# Patient Record
Sex: Female | Born: 1955 | Race: White | Hispanic: No | Marital: Married | Smoking: Never smoker
Health system: Southern US, Community
[De-identification: ages and names within clinical notes are randomized; demographics above are authoritative.]

## PROBLEM LIST (undated history)

## (undated) DIAGNOSIS — J45909 Unspecified asthma, uncomplicated: Secondary | ICD-10-CM

## (undated) DIAGNOSIS — I10 Essential (primary) hypertension: Secondary | ICD-10-CM

## (undated) HISTORY — PX: PLACEMENT OF BREAST IMPLANTS: SHX6334

## (undated) HISTORY — PX: SHOULDER ARTHROSCOPY: SHX128

## (undated) HISTORY — PX: ABDOMINAL HYSTERECTOMY: SHX81

## (undated) HISTORY — PX: CERVICAL DISC ARTHROPLASTY: SHX587

---

## 1999-10-31 ENCOUNTER — Other Ambulatory Visit: Admission: RE | Admit: 1999-10-31 | Discharge: 1999-10-31 | Payer: Self-pay | Admitting: Gynecology

## 2002-08-28 ENCOUNTER — Ambulatory Visit (HOSPITAL_COMMUNITY): Admission: RE | Admit: 2002-08-28 | Discharge: 2002-08-28 | Payer: Self-pay | Admitting: Gastroenterology

## 2002-08-28 ENCOUNTER — Encounter (INDEPENDENT_AMBULATORY_CARE_PROVIDER_SITE_OTHER): Payer: Self-pay | Admitting: Specialist

## 2004-02-08 ENCOUNTER — Other Ambulatory Visit: Admission: RE | Admit: 2004-02-08 | Discharge: 2004-02-08 | Payer: Self-pay | Admitting: Gynecology

## 2004-03-31 ENCOUNTER — Ambulatory Visit (HOSPITAL_COMMUNITY): Admission: RE | Admit: 2004-03-31 | Discharge: 2004-03-31 | Payer: Self-pay | Admitting: Family Medicine

## 2004-05-23 ENCOUNTER — Encounter: Admission: RE | Admit: 2004-05-23 | Discharge: 2004-05-23 | Payer: Self-pay | Admitting: Otolaryngology

## 2004-06-06 ENCOUNTER — Encounter: Admission: RE | Admit: 2004-06-06 | Discharge: 2004-06-06 | Payer: Self-pay | Admitting: Otolaryngology

## 2006-11-06 ENCOUNTER — Emergency Department (HOSPITAL_COMMUNITY): Admission: EM | Admit: 2006-11-06 | Discharge: 2006-11-06 | Payer: Self-pay | Admitting: Emergency Medicine

## 2008-10-13 ENCOUNTER — Encounter (INDEPENDENT_AMBULATORY_CARE_PROVIDER_SITE_OTHER): Payer: Self-pay | Admitting: Interventional Radiology

## 2008-10-13 ENCOUNTER — Encounter: Admission: RE | Admit: 2008-10-13 | Discharge: 2008-10-13 | Payer: Self-pay | Admitting: Family Medicine

## 2008-10-13 ENCOUNTER — Other Ambulatory Visit: Admission: RE | Admit: 2008-10-13 | Discharge: 2008-10-13 | Payer: Self-pay | Admitting: Interventional Radiology

## 2009-10-25 ENCOUNTER — Encounter: Admission: RE | Admit: 2009-10-25 | Discharge: 2009-10-25 | Payer: Self-pay | Admitting: Family Medicine

## 2010-10-24 ENCOUNTER — Ambulatory Visit
Admission: RE | Admit: 2010-10-24 | Discharge: 2010-10-24 | Payer: Self-pay | Source: Home / Self Care | Attending: Internal Medicine | Admitting: Internal Medicine

## 2011-02-09 NOTE — Op Note (Signed)
NAME:  Michelle Hanna, Michelle Hanna                         ACCOUNT NO.:  [id number]   MEDICAL RECORD NO.:  [id number]                   PATIENT TYPE:  AMB   LOCATION:  ENDO                                 FACILITY:  [facility]   PHYSICIAN:  Danise Edge, M.D.                DATE OF BIRTH:  12/16/1955   DATE OF PROCEDURE:  08/28/2002  DATE OF DISCHARGE:                                 OPERATIVE REPORT   PROCEDURE:  Colonoscopy.   PROCEDURE INDICATIONS:  The patient is a 55 year old female born Jan 26, 1956.  On July 20, 1996 underwent a proctocolonoscopy to the cecum which  was essentially normal except for the presence of left colonic  diverticulosis (mild).  Left colonic biopsies were performed to rule out  microscopic - cholanginous colitis.  Pathology interpretation of the left  colon biopsies returned focal active colitis with granulomas, strongly  suggestive of Crohn's colitis.   The patient has no clinical or colonoscopic evidence for the presence of  Crohn's disease and should not be diagnosed as having Crohn's disease at  this point.  She has symptoms compatible with irritable bowel syndrome  (abdominal cramps and nonbloody diarrhea).  A follow-up colonoscopy is  scheduled today.   ENDOSCOPIST:  Danise Edge, M.D.   PREMEDICATION:  Versed 5 mg, Demerol 50 mg   ENDOSCOPE:  Olympus pediatric colonoscope.   PROCEDURE IN DETAIL:  After obtaining informed consent, the patient was  placed in the left lateral decubitus position.  I administered intravenous  Demerol and intravenous Versed to achieve conscious sedation for the  procedure.  The patient's blood pressure, oxygen saturation and cardiac  rhythm were monitored throughout the procedure and documented in the medical  record.   Anal inspection was normal.  Digital rectal exam was normal.  The Olympus  pediatric video colonoscope was introduced into the rectum and advanced to  the cecum.  A normal appearing ileocecal valve was  intubated and the distal  ileum inspected.  Colonic preparation for the exam today was excellent.   Rectum:  Normal.   Sigmoid colon and descending colon:  A few colonic diverticula are noted  with normal appearing mucosa.   Splenic flexure:  Normal.   Transverse colon:  Normal.   Hepatic flexure:  Normal.   Ascending colon:  Normal.   Cecum and ileocecal valve:  Normal.   Distal ileum:  Normal.   Biopsies:  Random colonic biopsies from normal appearing colonic mucosa were  taken from the right colon and left colon to rule out microscopic-  cholanginous colitis.   ASSESSMENT:  A few diverticula are noted in the left colon; otherwise normal  proctocolonoscopy to the cecum and distal ileoscopy.  I find no colonoscopic  evidence for the presence of Crohn's colitis by today's exam, August 28, 2002 and by the colonoscopic exam performed July 20, 1996.   PLAN:  I plan to go  over the patient's biopsies with her.  I suspect she has  irritable bowel syndrome.  With two colonoscopic examines performed in 1997  and 2003 I find no evidence for the presence of inflammatory bowel disease.                                               Danise Edge, M.D.    MJ/MEDQ  D:  08/28/2002  T:  08/29/2002  Job:  [id number]   cc:   Dario Guardian, M.D.  [address]  [place]  [place] [address]  Fax: [phone]

## 2011-05-25 ENCOUNTER — Other Ambulatory Visit: Payer: Self-pay | Admitting: Obstetrics and Gynecology

## 2012-03-03 ENCOUNTER — Ambulatory Visit
Admission: RE | Admit: 2012-03-03 | Discharge: 2012-03-03 | Disposition: A | Discharge status: Home / Self Care | Payer: BC Managed Care – PPO | Source: Ambulatory Visit | Attending: Family Medicine | Admitting: Family Medicine

## 2012-03-03 ENCOUNTER — Other Ambulatory Visit: Payer: Self-pay | Admitting: Family Medicine

## 2012-03-03 DIAGNOSIS — R52 Pain, unspecified: Secondary | ICD-10-CM

## 2013-06-23 ENCOUNTER — Other Ambulatory Visit: Payer: Self-pay | Admitting: Obstetrics and Gynecology

## 2014-08-27 ENCOUNTER — Other Ambulatory Visit: Payer: Self-pay | Admitting: Obstetrics and Gynecology

## 2014-08-30 LAB — CYTOLOGY - PAP

## 2018-01-05 ENCOUNTER — Inpatient Hospital Stay (HOSPITAL_COMMUNITY)
Admission: EM | Admit: 2018-01-05 | Discharge: 2018-01-07 | DRG: 418 | Disposition: A | Discharge status: Home / Self Care | Payer: BC Managed Care – PPO | Attending: Surgery | Admitting: Surgery

## 2018-01-05 ENCOUNTER — Encounter (HOSPITAL_COMMUNITY): Payer: Self-pay | Admitting: Emergency Medicine

## 2018-01-05 ENCOUNTER — Emergency Department (HOSPITAL_COMMUNITY): Payer: BC Managed Care – PPO

## 2018-01-05 DIAGNOSIS — K8012 Calculus of gallbladder with acute and chronic cholecystitis without obstruction: Secondary | ICD-10-CM | POA: Diagnosis present

## 2018-01-05 DIAGNOSIS — Z888 Allergy status to other drugs, medicaments and biological substances status: Secondary | ICD-10-CM

## 2018-01-05 DIAGNOSIS — Z885 Allergy status to narcotic agent status: Secondary | ICD-10-CM

## 2018-01-05 DIAGNOSIS — K5090 Crohn's disease, unspecified, without complications: Secondary | ICD-10-CM | POA: Diagnosis present

## 2018-01-05 DIAGNOSIS — Z880 Allergy status to penicillin: Secondary | ICD-10-CM

## 2018-01-05 DIAGNOSIS — K810 Acute cholecystitis: Secondary | ICD-10-CM

## 2018-01-05 DIAGNOSIS — I10 Essential (primary) hypertension: Secondary | ICD-10-CM | POA: Diagnosis present

## 2018-01-05 DIAGNOSIS — R1011 Right upper quadrant pain: Secondary | ICD-10-CM

## 2018-01-05 DIAGNOSIS — F419 Anxiety disorder, unspecified: Secondary | ICD-10-CM | POA: Diagnosis present

## 2018-01-05 DIAGNOSIS — J45909 Unspecified asthma, uncomplicated: Secondary | ICD-10-CM | POA: Diagnosis present

## 2018-01-05 DIAGNOSIS — F329 Major depressive disorder, single episode, unspecified: Secondary | ICD-10-CM | POA: Diagnosis present

## 2018-01-05 DIAGNOSIS — Z419 Encounter for procedure for purposes other than remedying health state, unspecified: Secondary | ICD-10-CM

## 2018-01-05 DIAGNOSIS — K8000 Calculus of gallbladder with acute cholecystitis without obstruction: Secondary | ICD-10-CM | POA: Diagnosis present

## 2018-01-05 HISTORY — DX: Essential (primary) hypertension: I10

## 2018-01-05 HISTORY — DX: Unspecified asthma, uncomplicated: J45.909

## 2018-01-05 LAB — URINALYSIS, ROUTINE W REFLEX MICROSCOPIC
Bacteria, UA: NONE SEEN
Bilirubin Urine: NEGATIVE
GLUCOSE, UA: NEGATIVE mg/dL
Hgb urine dipstick: NEGATIVE
Ketones, ur: NEGATIVE mg/dL
LEUKOCYTES UA: NEGATIVE
Nitrite: NEGATIVE
PH: 8 (ref 5.0–8.0)
Protein, ur: 30 mg/dL — AB
SPECIFIC GRAVITY, URINE: 1.02 (ref 1.005–1.030)

## 2018-01-05 LAB — CBC
HCT: 38.2 % (ref 36.0–46.0)
Hemoglobin: 13.1 g/dL (ref 12.0–15.0)
MCH: 30.3 pg (ref 26.0–34.0)
MCHC: 34.3 g/dL (ref 30.0–36.0)
MCV: 88.4 fL (ref 78.0–100.0)
Platelets: 313 10*3/uL (ref 150–400)
RBC: 4.32 MIL/uL (ref 3.87–5.11)
RDW: 12.8 % (ref 11.5–15.5)
WBC: 9 10*3/uL (ref 4.0–10.5)

## 2018-01-05 LAB — LIPASE, BLOOD: LIPASE: 31 U/L (ref 11–51)

## 2018-01-05 LAB — COMPREHENSIVE METABOLIC PANEL
ALK PHOS: 110 U/L (ref 38–126)
ALT: 33 U/L (ref 14–54)
AST: 31 U/L (ref 15–41)
Albumin: 4 g/dL (ref 3.5–5.0)
Anion gap: 13 (ref 5–15)
BILIRUBIN TOTAL: 0.5 mg/dL (ref 0.3–1.2)
BUN: 13 mg/dL (ref 6–20)
CALCIUM: 9.8 mg/dL (ref 8.9–10.3)
CHLORIDE: 106 mmol/L (ref 101–111)
CO2: 20 mmol/L — ABNORMAL LOW (ref 22–32)
Creatinine, Ser: 0.82 mg/dL (ref 0.44–1.00)
Glucose, Bld: 114 mg/dL — ABNORMAL HIGH (ref 65–99)
Potassium: 3.9 mmol/L (ref 3.5–5.1)
Sodium: 139 mmol/L (ref 135–145)
Total Protein: 7.5 g/dL (ref 6.5–8.1)

## 2018-01-05 MED ORDER — HYDROMORPHONE HCL 2 MG/ML IJ SOLN
0.5000 mg | Freq: Once | INTRAMUSCULAR | Status: AC
  Administered 2018-01-05: 0.5 mg via INTRAVENOUS
  Filled 2018-01-05: qty 1

## 2018-01-05 MED ORDER — ONDANSETRON HCL 4 MG/2ML IJ SOLN: 4.0000 mg | Freq: Four times a day (QID) | INTRAMUSCULAR | Status: DC | PRN

## 2018-01-05 MED ORDER — SERTRALINE HCL 50 MG PO TABS
25.0000 mg | ORAL_TABLET | Freq: Every day | ORAL | Status: DC
  Administered 2018-01-06 – 2018-01-07 (×2): 25 mg via ORAL
  Filled 2018-01-05 (×3): qty 1

## 2018-01-05 MED ORDER — LISINOPRIL 5 MG PO TABS
2.5000 mg | ORAL_TABLET | Freq: Every day | ORAL | Status: DC
  Filled 2018-01-05 (×3): qty 1

## 2018-01-05 MED ORDER — DIPHENHYDRAMINE HCL 50 MG/ML IJ SOLN: 12.5000 mg | Freq: Four times a day (QID) | INTRAMUSCULAR | Status: DC | PRN

## 2018-01-05 MED ORDER — ONDANSETRON 4 MG PO TBDP: 4.0000 mg | ORAL_TABLET | Freq: Four times a day (QID) | ORAL | Status: DC | PRN

## 2018-01-05 MED ORDER — SODIUM CHLORIDE 0.9 % IV SOLN
INTRAVENOUS | Status: DC
  Administered 2018-01-05 – 2018-01-06 (×2): via INTRAVENOUS

## 2018-01-05 MED ORDER — ACETAMINOPHEN 650 MG RE SUPP: 650.0000 mg | Freq: Four times a day (QID) | RECTAL | Status: DC | PRN

## 2018-01-05 MED ORDER — MORPHINE SULFATE (PF) 4 MG/ML IV SOLN
2.0000 mg | INTRAVENOUS | Status: DC | PRN
  Administered 2018-01-05: 4 mg via INTRAVENOUS
  Filled 2018-01-05 (×2): qty 1

## 2018-01-05 MED ORDER — SODIUM CHLORIDE 0.9 % IV SOLN
2.0000 g | INTRAVENOUS | Status: DC
  Filled 2018-01-05 (×2): qty 20

## 2018-01-05 MED ORDER — ACETAMINOPHEN 325 MG PO TABS
650.0000 mg | ORAL_TABLET | Freq: Four times a day (QID) | ORAL | Status: DC | PRN
  Administered 2018-01-07 (×2): 650 mg via ORAL
  Filled 2018-01-05 (×2): qty 2

## 2018-01-05 MED ORDER — SODIUM CHLORIDE 0.9 % IV SOLN
2.0000 g | Freq: Once | INTRAVENOUS | Status: AC
  Administered 2018-01-05: 2 g via INTRAVENOUS
  Filled 2018-01-05: qty 20

## 2018-01-05 MED ORDER — DIPHENHYDRAMINE HCL 12.5 MG/5ML PO ELIX: 12.5000 mg | ORAL_SOLUTION | Freq: Four times a day (QID) | ORAL | Status: DC | PRN

## 2018-01-05 NOTE — ED Triage Notes (Signed)
Patient presents to ED for assessment RUQ pain which she has had 5 episodes, 3 in the past month.  Patient states she has been trying vinegar and other home remedies without relief.  Patient states pain started this morning at 330am, 3 episodes of diarrhea, c/o nausea, denies vomiting.

## 2018-01-05 NOTE — ED Notes (Signed)
Clear liquid dinner tray ordered for pt

## 2018-01-05 NOTE — ED Provider Notes (Signed)
[facility] EMERGENCY DEPARTMENT Provider Note   CSN: [id number] Arrival date & time: 01/05/18  1124     History   Chief Complaint Chief Complaint  Patient presents with  . Abdominal Pain    HPI Michelle Hanna is a 62 y.o. female who presents wi abdominal pain.  Past medical history significant for asthma, hypertension, Crohn's disease, anxiety/depression.  She states that she has had 3 "attacks" this month.  The pain will typically wake her up from sleep in the middle of the night and last for several hours and resolve on its own.  She has been trying apple cider vinegar mixed with apple juice which has provided some relief up until today.  She states that she thinks the problem is her gallbladder.  She endorses eating fatty meals last night.  She denies fever but has had chills, sweats, nausea. She's also had changes in her bowels however she is unsure if this was due to her Crohn's or not. Her stool has been lighter and loose. The pain is a squeezing pain over the right upper quadrant and right flank and sometimes it feels like being stabbed with a knife.  Movement makes it worse.  Nothing is made it better.  She denies chest pain, shortness of breath, vomiting, urinary symptoms. Past surgical history significant for C-section and abdominal hysterectomy.   HPI  Past Medical History:  Diagnosis Date  . Asthma   . Hypertension     There are no active problems to display for this patient.   Past Surgical History:  Procedure Laterality Date  . ABDOMINAL HYSTERECTOMY    . CERVICAL DISC ARTHROPLASTY    . CESAREAN SECTION    . PLACEMENT OF BREAST IMPLANTS    . SHOULDER ARTHROSCOPY       OB History   None      Home Medications    Prior to Admission medications   Not on File    Family History History reviewed. No pertinent family history.  Social History Social History   Tobacco Use  . Smoking status: Never Smoker  . Smokeless tobacco: Never  Used  Substance Use Topics  . Alcohol use: Yes    Comment: lightly  . Drug use: Never     Allergies   Codeine; Prednisone; and Penicillins   Review of Systems Review of Systems  Constitutional: Positive for chills and diaphoresis. Negative for fever.  Respiratory: Negative for shortness of breath.   Cardiovascular: Negative for chest pain.  Gastrointestinal: Positive for abdominal pain, diarrhea and nausea. Negative for constipation and vomiting.  Genitourinary: Positive for flank pain. Negative for difficulty urinating, dysuria, frequency and hematuria.  All other systems reviewed and are negative.    Physical Exam Updated Vital Signs BP 130/71 (BP Location: Left Arm)   Pulse 66   Temp 98.2 F (36.8 C) (Oral)   Resp (!) 24   SpO2 100%   Physical Exam  Constitutional: She is oriented to person, place, and time. She appears well-developed and well-nourished. She appears distressed (Mild distress due to pain).  HENT:  Head: Normocephalic and atraumatic.  Eyes: Pupils are equal, round, and reactive to light. Conjunctivae are normal. Right eye exhibits no discharge. Left eye exhibits no discharge. No scleral icterus.  Neck: Normal range of motion.  Cardiovascular: Normal rate and regular rhythm.  Pulmonary/Chest: Effort normal and breath sounds normal. No respiratory distress.  Abdominal: Soft. Bowel sounds are normal. She exhibits no distension and no mass. There  is tenderness (Epigastric, periumbilical, right upper quadrant tenderness). There is no rebound and no guarding. No hernia.  Right CVA tenderness  Neurological: She is alert and oriented to person, place, and time.  Skin: Skin is warm and dry.  Psychiatric: She has a normal mood and affect. Her behavior is normal.  Nursing note and vitals reviewed.    ED Treatments / Results  Labs (all labs ordered are listed, but only abnormal results are displayed) Labs Reviewed  COMPREHENSIVE METABOLIC PANEL - Abnormal;  Notable for the following components:      Result Value   CO2 20 (*)    Glucose, Bld 114 (*)    All other components within normal limits  URINALYSIS, ROUTINE W REFLEX MICROSCOPIC - Abnormal; Notable for the following components:   Protein, ur 30 (*)    Squamous Epithelial / LPF 0-5 (*)    All other components within normal limits  LIPASE, BLOOD  CBC    EKG None  Radiology No results found.  Procedures Procedures (including critical care time)  Medications Ordered in ED Medications  cefTRIAXone (ROCEPHIN) 2 g in sodium chloride 0.9 % 100 mL IVPB (2 g Intravenous New Bag/Given 01/05/18 1555)  HYDROmorphone (DILAUDID) injection 0.5 mg (0.5 mg Intravenous Given 01/05/18 1315)     Initial Impression / Assessment and Plan / ED Course  I have reviewed the triage vital signs and the nursing notes.  Pertinent labs & imaging results that were available during my care of the patient were reviewed by me and considered in my medical decision making (see chart for details).  61 year old female presents with right upper quadrant pain since 3 AM this morning.  Her vital signs are normal.  She is in mild distress due to pain.  She had significant pain relief with Dilaudid.  She has generalized tenderness on exam but the patient states that her pain is mostly in the right upper quadrant.  Her blood work is normal.  Her urine is normal.  Right upper quadrant ultrasound was ordered.  Ultrasound is remarkable for possible cholecystitis.  On recheck the patient appears much more comfortable.  She has been n.p.o. since 6 AM this morning.  Discussed with Dr. Corliss Skains who will come to see the patient.  4:46 PM Patient will be admitted by surgery.  Final Clinical Impressions(s) / ED Diagnoses   Final diagnoses:  RUQ pain  Acute cholecystitis    ED Discharge Orders    None       Bethel Born, PA-C 01/05/18 1646    Mancel Bale, MD 01/06/18 1258

## 2018-01-05 NOTE — H&P (Signed)
Michelle Hanna is an 62 y.o. female.    GI:  Buccini Chief Complaint: RUQ pain HPI: This is a 62 yo female with asthma, HTN, intermittent Crohns' disease, depression who presents with five separate "attacks" of RUQ abdominal pain.  This is the worst episode.  This began about 2 am with RUQ pain radiating around to the right flank.  Mild nausea, chills.  No diarrhea.  Pain slightly improved with IV pain meds.  Past Medical History:  Diagnosis Date  . Asthma   . Hypertension   Intermittent Crohns' disease - no meds  Past Surgical History:  Procedure Laterality Date  . ABDOMINAL HYSTERECTOMY    . CERVICAL DISC ARTHROPLASTY    . CESAREAN SECTION    . PLACEMENT OF BREAST IMPLANTS    . SHOULDER ARTHROSCOPY      History reviewed. No pertinent family history. Social History:  reports that she has never smoked. She has never used smokeless tobacco. She reports that she drinks alcohol. She reports that she does not use drugs.  Allergies:  Allergies  Allergen Reactions  . Codeine Itching and Nausea And Vomiting  . Prednisone Swelling  . Penicillins Rash    Prior to Admission medications   Not on File     Results for orders placed or performed during the hospital encounter of 01/05/18 (from the past 48 hour(s))  Lipase, blood     Status: None   Collection Time: 01/05/18 12:06 PM  Result Value Ref Range   Lipase 31 11 - 51 U/L    Comment: Performed at [facility], [place] [address], [place], [place] [address]  Comprehensive metabolic panel     Status: Abnormal   Collection Time: 01/05/18 12:06 PM  Result Value Ref Range   Sodium 139 135 - 145 mmol/L   Potassium 3.9 3.5 - 5.1 mmol/L   Chloride 106 101 - 111 mmol/L   CO2 20 (L) 22 - 32 mmol/L   Glucose, Bld 114 (H) 65 - 99 mg/dL   BUN 13 6 - 20 mg/dL   Creatinine, Ser 0.82 0.44 - 1.00 mg/dL   Calcium 9.8 8.9 - 10.3 mg/dL   Total Protein 7.5 6.5 - 8.1 g/dL   Albumin 4.0 3.5 - 5.0 g/dL   AST 31 15 - 41 U/L   ALT 33 14 -  54 U/L   Alkaline Phosphatase 110 38 - 126 U/L   Total Bilirubin 0.5 0.3 - 1.2 mg/dL   GFR calc non Af Amer >60 >60 mL/min   GFR calc Af Amer >60 >60 mL/min    Comment: (NOTE) The eGFR has been calculated using the CKD EPI equation. This calculation has not been validated in all clinical situations. eGFR's persistently <60 mL/min signify possible Chronic Kidney Disease.    Anion gap 13 5 - 15    Comment: Performed at [facility] [address] [place], [place] [address]  CBC     Status: None   Collection Time: 01/05/18 12:06 PM  Result Value Ref Range   WBC 9.0 4.0 - 10.5 K/uL   RBC 4.32 3.87 - 5.11 MIL/uL   Hemoglobin 13.1 12.0 - 15.0 g/dL   HCT 38.2 36.0 - 46.0 %   MCV 88.4 78.0 - 100.0 fL   MCH 30.3 26.0 - 34.0 pg   MCHC 34.3 30.0 - 36.0 g/dL   RDW 12.8 11.5 - 15.5 %   Platelets 313 150 - 400 K/uL    Comment: Performed at  [facility], [place] [address] [place], [place] [address]  Urinalysis, Routine w reflex microscopic     Status: Abnormal   Collection Time: 01/05/18 12:41 PM  Result Value Ref Range   Color, Urine YELLOW YELLOW   APPearance CLEAR CLEAR   Specific Gravity, Urine 1.020 1.005 - 1.030   pH 8.0 5.0 - 8.0   Glucose, UA NEGATIVE NEGATIVE mg/dL   Hgb urine dipstick NEGATIVE NEGATIVE   Bilirubin Urine NEGATIVE NEGATIVE   Ketones, ur NEGATIVE NEGATIVE mg/dL   Protein, ur 30 (A) NEGATIVE mg/dL   Nitrite NEGATIVE NEGATIVE   Leukocytes, UA NEGATIVE NEGATIVE   RBC / HPF 0-5 0 - 5 RBC/hpf   WBC, UA 0-5 0 - 5 WBC/hpf   Bacteria, UA NONE SEEN NONE SEEN   Squamous Epithelial / LPF 0-5 (A) NONE SEEN   Mucus PRESENT     Comment: Performed at [facility] [address], [place], [place] [address]   US Abdomen Limited Ruq  Result Date: 01/05/2018 CLINICAL DATA:  Right upper quadrant pain. EXAM: ULTRASOUND ABDOMEN LIMITED RIGHT UPPER QUADRANT COMPARISON:  Abdominal ultrasound dated November 06, 2006. FINDINGS: Gallbladder: Cholelithiasis and sludge  with borderline wall thickening and small amount of pericholecystic fluid. No sonographic Murphy sign noted by sonographer. Common bile duct: Diameter: 5 mm, normal. Liver: There is a small 1.0 cm simple cyst in the liver adjacent to the gallbladder, minimally increased in size when compared to prior study from February 2008. Within normal limits in parenchymal echogenicity. Portal vein is patent on color Doppler imaging with normal direction of blood flow towards the liver. IMPRESSION: 1. Cholelithiasis and sludge with borderline wall thickening and small amount of pericholecystic fluid. Findings are suspicious for acute cholecystitis. Electronically Signed   By: Titus Dubin M.D.   On: 01/05/2018 15:29    Review of Systems  Constitutional: Negative for weight loss.  HENT: Negative for ear discharge, ear pain, hearing loss and tinnitus.   Eyes: Negative for blurred vision, double vision, photophobia and pain.  Respiratory: Negative for cough, sputum production and shortness of breath.   Cardiovascular: Negative for chest pain.  Gastrointestinal: Positive for abdominal pain and nausea. Negative for vomiting.  Genitourinary: Positive for flank pain. Negative for dysuria, frequency and urgency.  Musculoskeletal: Negative for back pain, falls, joint pain, myalgias and neck pain.  Neurological: Negative for dizziness, tingling, sensory change, focal weakness, loss of consciousness and headaches.  Endo/Heme/Allergies: Does not bruise/bleed easily.  Psychiatric/Behavioral: Negative for depression, memory loss and substance abuse. The patient is not nervous/anxious.     Blood pressure (!) 148/73, pulse 77, temperature 98.2 F (36.8 C), temperature source Oral, resp. rate 18, SpO2 100 %. Physical Exam  WDWN in NAD Eyes:  Pupils equal, round; sclera anicteric HENT:  Oral mucosa moist; good dentition  Neck:  No masses palpated, no thyromegaly Lungs:  CTA bilaterally; normal respiratory effort CV:   Regular rate and rhythm; no murmurs; extremities well-perfused with no edema Abd:  +bowel sounds, soft, tender in epigastrium, right upper quadrant around to right flank, no palpable organomegaly; no palpable hernias Skin:  Warm, dry; no sign of jaundice Psychiatric - alert and oriented x 4; calm mood and affect  Assessment/Plan Acute calculous cholecystitis.  Admit for IV hydration, IV antibiotics Plan lap chole by Dr. Brantley Stage tomorrow.  Discussed with patient.  Maia Petties, MD 01/05/2018, 4:27 PM

## 2018-01-05 NOTE — ED Notes (Signed)
Patient transported to US 

## 2018-01-06 ENCOUNTER — Encounter (HOSPITAL_COMMUNITY): Payer: Self-pay | Admitting: Certified Registered Nurse Anesthetist

## 2018-01-06 ENCOUNTER — Inpatient Hospital Stay (HOSPITAL_COMMUNITY): Payer: BC Managed Care – PPO | Admitting: Certified Registered Nurse Anesthetist

## 2018-01-06 ENCOUNTER — Inpatient Hospital Stay (HOSPITAL_COMMUNITY): Payer: BC Managed Care – PPO

## 2018-01-06 ENCOUNTER — Other Ambulatory Visit: Payer: Self-pay

## 2018-01-06 ENCOUNTER — Encounter (HOSPITAL_COMMUNITY): Admission: EM | Disposition: A | Discharge status: Home / Self Care | Payer: Self-pay | Source: Home / Self Care

## 2018-01-06 HISTORY — PX: CHOLECYSTECTOMY: SHX55

## 2018-01-06 LAB — MRSA PCR SCREENING: MRSA BY PCR: NEGATIVE

## 2018-01-06 LAB — HIV ANTIBODY (ROUTINE TESTING W REFLEX): HIV Screen 4th Generation wRfx: NONREACTIVE

## 2018-01-06 SURGERY — LAPAROSCOPIC CHOLECYSTECTOMY WITH INTRAOPERATIVE CHOLANGIOGRAM
Anesthesia: General | Site: Abdomen

## 2018-01-06 MED ORDER — CHLORHEXIDINE GLUCONATE CLOTH 2 % EX PADS: 6.0000 | MEDICATED_PAD | Freq: Once | CUTANEOUS | Status: DC

## 2018-01-06 MED ORDER — STERILE WATER FOR IRRIGATION IR SOLN
Status: DC | PRN
  Administered 2018-01-06: 1000 mL

## 2018-01-06 MED ORDER — ROCURONIUM BROMIDE 10 MG/ML (PF) SYRINGE
PREFILLED_SYRINGE | INTRAVENOUS | Status: AC
  Filled 2018-01-06: qty 5

## 2018-01-06 MED ORDER — MIDAZOLAM HCL 5 MG/5ML IJ SOLN
INTRAMUSCULAR | Status: DC | PRN
  Administered 2018-01-06: 2 mg via INTRAVENOUS

## 2018-01-06 MED ORDER — ROCURONIUM BROMIDE 100 MG/10ML IV SOLN
INTRAVENOUS | Status: DC | PRN
  Administered 2018-01-06: 10 mg via INTRAVENOUS
  Administered 2018-01-06: 40 mg via INTRAVENOUS

## 2018-01-06 MED ORDER — DEXAMETHASONE SODIUM PHOSPHATE 10 MG/ML IJ SOLN
INTRAMUSCULAR | Status: AC
  Filled 2018-01-06: qty 1

## 2018-01-06 MED ORDER — SUGAMMADEX SODIUM 200 MG/2ML IV SOLN
INTRAVENOUS | Status: AC
  Filled 2018-01-06: qty 4

## 2018-01-06 MED ORDER — GABAPENTIN 300 MG PO CAPS
300.0000 mg | ORAL_CAPSULE | ORAL | Status: AC
  Administered 2018-01-06: 300 mg via ORAL

## 2018-01-06 MED ORDER — SODIUM CHLORIDE 0.9 % IR SOLN
Status: DC | PRN
  Administered 2018-01-06: 1000 mL

## 2018-01-06 MED ORDER — PROMETHAZINE HCL 25 MG/ML IJ SOLN: 6.2500 mg | INTRAMUSCULAR | Status: DC | PRN

## 2018-01-06 MED ORDER — ONDANSETRON HCL 4 MG/2ML IJ SOLN
INTRAMUSCULAR | Status: AC
  Filled 2018-01-06: qty 2

## 2018-01-06 MED ORDER — FENTANYL CITRATE (PF) 250 MCG/5ML IJ SOLN
INTRAMUSCULAR | Status: AC
  Filled 2018-01-06: qty 5

## 2018-01-06 MED ORDER — FENTANYL CITRATE (PF) 100 MCG/2ML IJ SOLN
INTRAMUSCULAR | Status: DC | PRN
  Administered 2018-01-06 (×2): 25 ug via INTRAVENOUS
  Administered 2018-01-06 (×2): 100 ug via INTRAVENOUS

## 2018-01-06 MED ORDER — OXYCODONE-ACETAMINOPHEN 5-325 MG PO TABS
1.0000 | ORAL_TABLET | ORAL | Status: DC | PRN
Start: 2018-01-06 — End: 2018-01-07
  Administered 2018-01-06: 1 via ORAL
  Filled 2018-01-06 (×2): qty 1

## 2018-01-06 MED ORDER — LACTATED RINGERS IV SOLN
INTRAVENOUS | Status: DC
  Administered 2018-01-06 (×3): via INTRAVENOUS

## 2018-01-06 MED ORDER — 0.9 % SODIUM CHLORIDE (POUR BTL) OPTIME
TOPICAL | Status: DC | PRN
  Administered 2018-01-06: 1000 mL

## 2018-01-06 MED ORDER — MEPERIDINE HCL 50 MG/ML IJ SOLN: 6.2500 mg | INTRAMUSCULAR | Status: DC | PRN

## 2018-01-06 MED ORDER — IOPAMIDOL (ISOVUE-300) INJECTION 61%
INTRAVENOUS | Status: AC
  Filled 2018-01-06: qty 50

## 2018-01-06 MED ORDER — SODIUM CHLORIDE 0.9 % IV SOLN
INTRAVENOUS | Status: DC | PRN
  Administered 2018-01-06: 100 mL

## 2018-01-06 MED ORDER — DEXMEDETOMIDINE HCL IN NACL 200 MCG/50ML IV SOLN
INTRAVENOUS | Status: DC | PRN
  Administered 2018-01-06: 36 ug via INTRAVENOUS

## 2018-01-06 MED ORDER — BUPIVACAINE-EPINEPHRINE (PF) 0.25% -1:200000 IJ SOLN
INTRAMUSCULAR | Status: AC
  Filled 2018-01-06: qty 30

## 2018-01-06 MED ORDER — PHENYLEPHRINE 40 MCG/ML (10ML) SYRINGE FOR IV PUSH (FOR BLOOD PRESSURE SUPPORT)
PREFILLED_SYRINGE | INTRAVENOUS | Status: AC
  Filled 2018-01-06: qty 10

## 2018-01-06 MED ORDER — HYDROMORPHONE HCL 2 MG/ML IJ SOLN: 0.2500 mg | INTRAMUSCULAR | Status: DC | PRN

## 2018-01-06 MED ORDER — ACETAMINOPHEN 500 MG PO TABS
1000.0000 mg | ORAL_TABLET | ORAL | Status: AC
  Administered 2018-01-06: 1000 mg via ORAL
  Filled 2018-01-06: qty 2

## 2018-01-06 MED ORDER — CHLORHEXIDINE GLUCONATE CLOTH 2 % EX PADS
6.0000 | MEDICATED_PAD | Freq: Once | CUTANEOUS | Status: AC
  Administered 2018-01-06: 6 via TOPICAL

## 2018-01-06 MED ORDER — HYDROCODONE-ACETAMINOPHEN 7.5-325 MG PO TABS: 1.0000 | ORAL_TABLET | Freq: Once | ORAL | Status: DC | PRN

## 2018-01-06 MED ORDER — ONDANSETRON HCL 4 MG/2ML IJ SOLN
INTRAMUSCULAR | Status: DC | PRN
  Administered 2018-01-06: 4 mg via INTRAVENOUS

## 2018-01-06 MED ORDER — SCOPOLAMINE 1 MG/3DAYS TD PT72
1.0000 | MEDICATED_PATCH | Freq: Once | TRANSDERMAL | Status: DC
  Administered 2018-01-06: 1.5 mg via TRANSDERMAL

## 2018-01-06 MED ORDER — SUGAMMADEX SODIUM 200 MG/2ML IV SOLN
INTRAVENOUS | Status: DC | PRN
  Administered 2018-01-06: 200 mg via INTRAVENOUS

## 2018-01-06 MED ORDER — BUPIVACAINE-EPINEPHRINE 0.25% -1:200000 IJ SOLN
INTRAMUSCULAR | Status: DC | PRN
  Administered 2018-01-06: 30 mL

## 2018-01-06 MED ORDER — CLINDAMYCIN PHOSPHATE 900 MG/50ML IV SOLN
900.0000 mg | INTRAVENOUS | Status: AC
  Administered 2018-01-06: 900 mg via INTRAVENOUS
  Filled 2018-01-06: qty 50

## 2018-01-06 MED ORDER — DEXAMETHASONE SODIUM PHOSPHATE 4 MG/ML IJ SOLN
INTRAMUSCULAR | Status: DC | PRN
  Administered 2018-01-06: 10 mg via INTRAVENOUS

## 2018-01-06 MED ORDER — SCOPOLAMINE 1 MG/3DAYS TD PT72
MEDICATED_PATCH | TRANSDERMAL | Status: AC
  Administered 2018-01-06: 1.5 mg via TRANSDERMAL
  Filled 2018-01-06: qty 1

## 2018-01-06 MED ORDER — MIDAZOLAM HCL 2 MG/2ML IJ SOLN
INTRAMUSCULAR | Status: AC
  Filled 2018-01-06: qty 2

## 2018-01-06 MED ORDER — EPHEDRINE SULFATE 50 MG/ML IJ SOLN
INTRAMUSCULAR | Status: DC | PRN
  Administered 2018-01-06: 10 mg via INTRAVENOUS

## 2018-01-06 MED ORDER — PROPOFOL 10 MG/ML IV BOLUS
INTRAVENOUS | Status: AC
  Filled 2018-01-06: qty 20

## 2018-01-06 MED ORDER — CELECOXIB 200 MG PO CAPS
200.0000 mg | ORAL_CAPSULE | ORAL | Status: AC
  Administered 2018-01-06: 200 mg via ORAL
  Filled 2018-01-06: qty 1

## 2018-01-06 MED ORDER — LIDOCAINE HCL (CARDIAC) 20 MG/ML IV SOLN
INTRAVENOUS | Status: DC | PRN
  Administered 2018-01-06: 100 mg via INTRAVENOUS

## 2018-01-06 MED ORDER — PROPOFOL 10 MG/ML IV BOLUS
INTRAVENOUS | Status: DC | PRN
  Administered 2018-01-06 (×2): 10 mg via INTRAVENOUS
  Administered 2018-01-06: 170 mg via INTRAVENOUS

## 2018-01-06 MED ORDER — ACETAMINOPHEN 10 MG/ML IV SOLN: 1000.0000 mg | Freq: Once | INTRAVENOUS | Status: DC | PRN

## 2018-01-06 SURGICAL SUPPLY — 47 items
ADH SKN CLS APL DERMABOND .7 (GAUZE/BANDAGES/DRESSINGS) ×1
APPLIER CLIP ROT 10 11.4 M/L (STAPLE) ×3
APR CLP MED LRG 11.4X10 (STAPLE) ×1
BAG SPEC RTRVL LRG 6X4 10 (ENDOMECHANICALS) ×1
BLADE CLIPPER SURG (BLADE) IMPLANT
CANISTER SUCT 3000ML PPV (MISCELLANEOUS) ×3 IMPLANT
CHLORAPREP W/TINT 26ML (MISCELLANEOUS) ×3 IMPLANT
CLIP APPLIE ROT 10 11.4 M/L (STAPLE) ×1 IMPLANT
COVER MAYO STAND STRL (DRAPES) ×3 IMPLANT
COVER SURGICAL LIGHT HANDLE (MISCELLANEOUS) ×3 IMPLANT
DERMABOND ADVANCED (GAUZE/BANDAGES/DRESSINGS) ×2
DERMABOND ADVANCED .7 DNX12 (GAUZE/BANDAGES/DRESSINGS) ×1 IMPLANT
DRAPE C-ARM 42X72 X-RAY (DRAPES) ×3 IMPLANT
DRAPE WARM FLUID 44X44 (DRAPE) ×3 IMPLANT
ELECT REM PT RETURN 9FT ADLT (ELECTROSURGICAL) ×3
ELECTRODE REM PT RTRN 9FT ADLT (ELECTROSURGICAL) ×1 IMPLANT
GLOVE BIO SURGEON STRL SZ 6 (GLOVE) ×2 IMPLANT
GLOVE BIO SURGEON STRL SZ8 (GLOVE) ×3 IMPLANT
GLOVE BIOGEL PI IND STRL 6.5 (GLOVE) IMPLANT
GLOVE BIOGEL PI IND STRL 8 (GLOVE) ×1 IMPLANT
GLOVE BIOGEL PI INDICATOR 6.5 (GLOVE) ×6
GLOVE BIOGEL PI INDICATOR 8 (GLOVE) ×4
GLOVE ECLIPSE 6.0 STRL STRAW (GLOVE) ×2 IMPLANT
GLOVE SURG SS PI 6.5 STRL IVOR (GLOVE) ×2 IMPLANT
GOWN STRL REUS W/ TWL LRG LVL3 (GOWN DISPOSABLE) ×2 IMPLANT
GOWN STRL REUS W/ TWL XL LVL3 (GOWN DISPOSABLE) ×1 IMPLANT
GOWN STRL REUS W/TWL LRG LVL3 (GOWN DISPOSABLE) ×9
GOWN STRL REUS W/TWL XL LVL3 (GOWN DISPOSABLE) ×6
KIT BASIN OR (CUSTOM PROCEDURE TRAY) ×3 IMPLANT
KIT TURNOVER KIT B (KITS) ×3 IMPLANT
NS IRRIG 1000ML POUR BTL (IV SOLUTION) ×3 IMPLANT
PAD ARMBOARD 7.5X6 YLW CONV (MISCELLANEOUS) ×3 IMPLANT
POUCH SPECIMEN RETRIEVAL 10MM (ENDOMECHANICALS) ×3 IMPLANT
SCISSORS LAP 5X35 DISP (ENDOMECHANICALS) ×3 IMPLANT
SET CHOLANGIOGRAPH 5 50 .035 (SET/KITS/TRAYS/PACK) ×3 IMPLANT
SET IRRIG TUBING LAPAROSCOPIC (IRRIGATION / IRRIGATOR) ×3 IMPLANT
SLEEVE ENDOPATH XCEL 5M (ENDOMECHANICALS) ×3 IMPLANT
SPECIMEN JAR SMALL (MISCELLANEOUS) ×3 IMPLANT
SUT MNCRL AB 4-0 PS2 18 (SUTURE) ×5 IMPLANT
TOWEL OR 17X24 6PK STRL BLUE (TOWEL DISPOSABLE) ×3 IMPLANT
TOWEL OR 17X26 10 PK STRL BLUE (TOWEL DISPOSABLE) ×1 IMPLANT
TRAY LAPAROSCOPIC MC (CUSTOM PROCEDURE TRAY) ×3 IMPLANT
TROCAR XCEL BLUNT TIP 100MML (ENDOMECHANICALS) ×3 IMPLANT
TROCAR XCEL NON-BLD 11X100MML (ENDOMECHANICALS) ×3 IMPLANT
TROCAR XCEL NON-BLD 5MMX100MML (ENDOMECHANICALS) ×3 IMPLANT
TUBING INSUFFLATION (TUBING) ×3 IMPLANT
WATER STERILE IRR 1000ML POUR (IV SOLUTION) ×3 IMPLANT

## 2018-01-06 NOTE — Interval H&P Note (Signed)
History and Physical Interval Note:  01/06/2018 11:44 AM  Michelle Hanna  has presented today for surgery, with the diagnosis of acute cholecystitis  The various methods of treatment have been discussed with the patient and family. After consideration of risks, benefits and other options for treatment, the patient has consented to  Procedure(s): LAPAROSCOPIC CHOLECYSTECTOMY WITH INTRAOPERATIVE CHOLANGIOGRAM (N/A) as a surgical intervention .  The patient's history has been reviewed, patient examined, no change in status, stable for surgery.  I have reviewed the patient's chart and labs.  Questions were answered to the patient's satisfaction.     Jamarcus Laduke A Devan Babino

## 2018-01-06 NOTE — Transfer of Care (Signed)
Immediate Anesthesia Transfer of Care Note  Patient: Michelle Hanna  Procedure(s) Performed: LAPAROSCOPIC CHOLECYSTECTOMY WITH INTRAOPERATIVE CHOLANGIOGRAM (N/A Abdomen)  Patient Location: PACU  Anesthesia Type:General  Level of Consciousness: awake, alert  and oriented  Airway & Oxygen Therapy: Patient Spontanous Breathing and Patient connected to nasal cannula oxygen  Post-op Assessment: Report given to RN, Post -op Vital signs reviewed and stable and Patient moving all extremities X 4  Post vital signs: Reviewed and stable  Last Vitals:  Vitals Value Taken Time  BP 118/63 01/06/2018  2:33 PM  Temp 36.5 C 01/06/2018  2:33 PM  Pulse 79 01/06/2018  2:45 PM  Resp 6 01/06/2018  2:45 PM  SpO2 96 % 01/06/2018  2:45 PM  Vitals shown include unvalidated device data.  Last Pain:  Vitals:   01/06/18 1433  TempSrc:   PainSc: 0-No pain      Patients Stated Pain Goal: 3 (01/06/18 0000)  Complications: No apparent anesthesia complications

## 2018-01-06 NOTE — Discharge Summary (Signed)
     Patient ID: BRUCE MARTINDALE [id number] 1956-01-27 62 y.o.  Admit date: 01/05/2018 Discharge date: 01/07/2018  Admitting Diagnosis: cholecystitis  Discharge Diagnosis Patient Active Problem List   Diagnosis Date Noted  . Gallbladder calculus with acute cholecystitis 01/05/2018    Consultants none  Reason for Admission: This is a 62 yo female with asthma, HTN, intermittent Crohns' disease, depression who presents with five separate "attacks" of RUQ abdominal pain.  This is the worst episode.  This began about 2 am with RUQ pain radiating around to the right flank.  Mild nausea, chills.  No diarrhea.  Pain slightly improved with IV pain meds.  Procedures Laparoscopic cholecystectomy with [facility]  Hospital Course:  The patient was admitted and underwent a laparoscopic cholecystectomy with IOC.  The patient tolerated the procedure well.  On POD 1, the patient was tolerating a regular diet, voiding well, mobilizing, and pain was controlled with oral pain medications(tylenol).  The patient was stable for DC home at this time with appropriate follow up made.    Physical Exam: Abd: soft, minimally tender, +BS, ND, incisions are c/d/i with dermabond present  Allergies as of 01/07/2018      Reactions   Codeine Itching, Nausea And Vomiting   Oxycodone Itching   Patient stated she experiences severe itching after taking oxycodone   Prednisone Swelling   Penicillins Rash      Medication List    TAKE these medications   ALPRAZolam 0.5 MG tablet Commonly known as:  XANAX Take 0.5 mg by mouth daily.   aspirin EC 81 MG tablet Take 81 mg by mouth daily.   Calcium-Vitamin D3 600-400 MG-UNIT Caps Take 2 tablets by mouth daily.   cholecalciferol 1000 units tablet Commonly known as:  VITAMIN D Take 1,000 Units by mouth daily.   co-enzyme Q-10 30 MG capsule Take 30 mg by mouth daily.   diclofenac sodium 1 % Gel Commonly known as:  VOLTAREN Apply 2 g topically every 6 (six)  hours.   lisinopril 2.5 MG tablet Commonly known as:  PRINIVIL,ZESTRIL Take 2.5 mg by mouth daily.   loratadine 10 MG tablet Commonly known as:  CLARITIN Take 10 mg by mouth daily.   sertraline 50 MG tablet Commonly known as:  ZOLOFT Take 25 mg by mouth daily.        Follow-up Information    Surgery, [facility]. Go on 01/21/2018.   Specialty:  General Surgery Why:  Your appointment is at 11 AM. Please arrive 30 min prior to appointment time. Bring photo ID and insurance information.  Contact information: [address] [place] [address] [phone]           Signed: Barnetta Chapel, [facility] 01/07/2018, 10:17 AM Pager: [phone]

## 2018-01-06 NOTE — Discharge Instructions (Signed)
Please arrive at least 30 min before your appointment to complete your check in paperwork.  If you are unable to arrive 30 min prior to your appointment time we may have to cancel or reschedule you. ° °LAPAROSCOPIC SURGERY: POST OP INSTRUCTIONS  °1. DIET: Follow a light bland diet the first 24 hours after arrival home, such as soup, liquids, crackers, etc. Be sure to include lots of fluids daily. Avoid fast food or heavy meals as your are more likely to get nauseated. Eat a low fat the next few days after surgery.  °2. Take your usually prescribed home medications unless otherwise directed. °3. PAIN CONTROL:  °1. Pain is best controlled by a usual combination of three different methods TOGETHER:  °1. Ice/Heat °2. Over the counter pain medication °3. Prescription pain medication °2. Most patients will experience some swelling and bruising around the incisions. Ice packs or heating pads (30-60 minutes up to 6 times a day) will help. Use ice for the first few days to help decrease swelling and bruising, then switch to heat to help relax tight/sore spots and speed recovery. Some people prefer to use ice alone, heat alone, alternating between ice & heat. Experiment to what works for you. Swelling and bruising can take several weeks to resolve.  °3. It is helpful to take an over-the-counter pain medication regularly for the first few weeks. Choose one of the following that works best for you:  °1. Naproxen (Aleve, etc) Two 220mg tabs twice a day °2. Ibuprofen (Advil, etc) Three 200mg tabs four times a day (every meal & bedtime) °3. Acetaminophen (Tylenol, etc) 500-650mg four times a day (every meal & bedtime) °4. A prescription for pain medication (such as oxycodone, hydrocodone, etc) should be given to you upon discharge. Take your pain medication as prescribed.  °1. If you are having problems/concerns with the prescription medicine (does not control pain, nausea, vomiting, rash, itching, etc), please call us [phone] to see if we need to switch you to a different pain medicine that will work better for you and/or control your side effect better. °2. If you need a refill on your pain medication, please contact your pharmacy. They will contact our office to request authorization. Prescriptions will not be filled after 5 pm or on week-ends. °4. Avoid getting constipated. Between the surgery and the pain medications, it is common to experience some constipation. Increasing fluid intake and taking a fiber supplement (such as Metamucil, Citrucel, FiberCon, MiraLax, etc) 1-2 times a day regularly will usually help prevent this problem from occurring. A mild laxative (prune juice, Milk of Magnesia, MiraLax, etc) should be taken according to package directions if there are no bowel movements after 48 hours.  °5. Watch out for diarrhea. If you have many loose bowel movements, simplify your diet to bland foods & liquids for a few days. Stop any stool softeners and decrease your fiber supplement. Switching to mild anti-diarrheal medications (Kayopectate, Pepto Bismol) can help. If this worsens or does not improve, please call us. °6. Wash / shower every day. You may shower over the dressings as they are waterproof. Continue to shower over incision(s) after the dressing is off. °7. Remove your waterproof bandages 5 days after surgery. You may leave the incision open to air. You may replace a dressing/Band-Aid to cover the incision for comfort if you wish.  °8. ACTIVITIES as tolerated:  °1. You may resume regular (light) daily activities beginning the next day--such as daily self-care, walking, climbing stairs--gradually   increasing activities as tolerated. If you can walk 30 minutes without difficulty, it is safe to try more intense activity such as jogging, treadmill, bicycling, low-impact aerobics, swimming, etc. °2. Save the most intensive and strenuous activity for last such as sit-ups, heavy lifting, contact sports, etc Refrain  from any heavy lifting or straining until you are off narcotics for pain control.  °3. DO NOT PUSH THROUGH PAIN. Let pain be your guide: If it hurts to do something, don't do it. Pain is your body warning you to avoid that activity for another week until the pain goes down. °4. You may drive when you are no longer taking prescription pain medication, you can comfortably wear a seatbelt, and you can safely maneuver your car and apply brakes. °5. You may have sexual intercourse when it is comfortable.  °9. FOLLOW UP in our office  °1. Please call [facility] at [phone] to set up an appointment to see your surgeon in the office for a follow-up appointment approximately 2-3 weeks after your surgery. °2. Make sure that you call for this appointment the day you arrive home to insure a convenient appointment time. °     10. IF YOU HAVE DISABILITY OR FAMILY LEAVE FORMS, BRING THEM TO THE               OFFICE FOR PROCESSING.  ° °WHEN TO CALL US [phone]:  °1. Poor pain control °2. Reactions / problems with new medications (rash/itching, nausea, etc)  °3. Fever over 101.5 F (38.5 C) °4. Inability to urinate °5. Nausea and/or vomiting °6. Worsening swelling or bruising °7. Continued bleeding from incision. °8. Increased pain, redness, or drainage from the incision ° °The clinic staff is available to answer your questions during regular business hours (8:30am-5pm). Please don’t hesitate to call and ask to speak to one of our nurses for clinical concerns.  °If you have a medical emergency, go to the nearest emergency room or call 911.  °A surgeon from [facility] is always on call at the hospitals  ° °[facility]  °[address], [address], [place], [place] [address] ?  °MAIN: [phone] ? TOLL FREE: [phone] ?  °FAX [phone]  °[email or web address] ° °

## 2018-01-06 NOTE — Anesthesia Postprocedure Evaluation (Signed)
Anesthesia Post Note  Patient: Michelle Hanna  Procedure(s) Performed: LAPAROSCOPIC CHOLECYSTECTOMY WITH INTRAOPERATIVE CHOLANGIOGRAM (N/A Abdomen)     Patient location during evaluation: PACU Anesthesia Type: General Level of consciousness: awake and alert Pain management: pain level controlled Vital Signs Assessment: post-procedure vital signs reviewed and stable Respiratory status: spontaneous breathing, nonlabored ventilation, respiratory function stable and patient connected to nasal cannula oxygen Cardiovascular status: blood pressure returned to baseline and stable Postop Assessment: no apparent nausea or vomiting Anesthetic complications: no    Last Vitals:  Vitals:   01/06/18 1450 01/06/18 1500  BP:  119/65  Pulse:  82  Resp:  16  Temp: 36.6 C 36.8 C  SpO2:  100%    Last Pain:  Vitals:   01/06/18 1500  TempSrc: Oral  PainSc:                  Trevor Iha

## 2018-01-06 NOTE — Progress Notes (Signed)
   Subjective/Chief Complaint: Pain better    Objective: Vital signs in last 24 hours: Temp:  [98.2 F (36.8 C)-98.7 F (37.1 C)] 98.6 F (37 C) (04/15 0512) Pulse Rate:  [66-80] 70 (04/15 0512) Resp:  [16-24] 17 (04/15 0512) BP: (108-148)/(61-78) 109/71 (04/15 0512) SpO2:  [96 %-100 %] 98 % (04/15 0512)    Intake/Output from previous day: 04/14 0701 - 04/15 0700 In: 60 [P.O.:60] Out: 1 [Urine:1] Intake/Output this shift: No intake/output data recorded.  GI: tender RUQ abdomen   Lab Results:  Recent Labs    01/05/18 1206  WBC 9.0  HGB 13.1  HCT 38.2  PLT 313   BMET Recent Labs    01/05/18 1206  NA 139  K 3.9  CL 106  CO2 20*  GLUCOSE 114*  BUN 13  CREATININE 0.82  CALCIUM 9.8   PT/INR No results for input(s): LABPROT, INR in the last 72 hours. ABG No results for input(s): PHART, HCO3 in the last 72 hours.  Invalid input(s): PCO2, PO2  Studies/Results: US Abdomen Limited Ruq  Result Date: 01/05/2018 CLINICAL DATA:  Right upper quadrant pain. EXAM: ULTRASOUND ABDOMEN LIMITED RIGHT UPPER QUADRANT COMPARISON:  Abdominal ultrasound dated November 06, 2006. FINDINGS: Gallbladder: Cholelithiasis and sludge with borderline wall thickening and small amount of pericholecystic fluid. No sonographic Murphy sign noted by sonographer. Common bile duct: Diameter: 5 mm, normal. Liver: There is a small 1.0 cm simple cyst in the liver adjacent to the gallbladder, minimally increased in size when compared to prior study from February 2008. Within normal limits in parenchymal echogenicity. Portal vein is patent on color Doppler imaging with normal direction of blood flow towards the liver. IMPRESSION: 1. Cholelithiasis and sludge with borderline wall thickening and small amount of pericholecystic fluid. Findings are suspicious for acute cholecystitis. Electronically Signed   By: Obie Dredge M.D.   On: 01/05/2018 15:29    Anti-infectives: Anti-infectives (From  admission, onward)   Start     Dose/Rate Route Frequency Ordered Stop   01/05/18 1800  cefTRIAXone (ROCEPHIN) 2 g in sodium chloride 0.9 % 100 mL IVPB     2 g 200 mL/hr over 30 Minutes Intravenous Every 24 hours 01/05/18 1754     01/05/18 1545  cefTRIAXone (ROCEPHIN) 2 g in sodium chloride 0.9 % 100 mL IVPB     2 g 200 mL/hr over 30 Minutes Intravenous  Once 01/05/18 1544 01/05/18 1645      Assessment/Plan: Acute cholecystitis   Recommend laparoscopic cholecystectomy  and cholangiogram    The procedure has been discussed with the patient. Operative and non operative treatments have been discussed. Risks of surgery include bleeding, infection,  Common bile duct injury,  Injury to the stomach,liver, colon,small intestine, abdominal wall,  Diaphragm,  Major blood vessels,  And the need for an open procedure.  Other risks include worsening of medical problems, death,  DVT and pulmonary embolism, and cardiovascular events.   Medical options have also been discussed. The patient has been informed of long term expectations of surgery and non surgical options,  The patient agrees to proceed.    LOS: 1 day    Clovis Pu Gertrue Willette 01/06/2018

## 2018-01-06 NOTE — Anesthesia Preprocedure Evaluation (Signed)
Anesthesia Evaluation  Patient identified by MRN, date of birth, ID band Patient awake    Reviewed: Allergy & Precautions, NPO status , Patient's Chart, lab work & pertinent test results  Airway Mallampati: II  TM Distance: >3 FB Neck ROM: Full    Dental no notable dental hx.    Pulmonary asthma ,    Pulmonary exam normal breath sounds clear to auscultation       Cardiovascular Exercise Tolerance: Good hypertension, Pt. on medications Normal cardiovascular exam Rhythm:Regular Rate:Normal     Neuro/Psych negative neurological ROS  negative psych ROS   GI/Hepatic Neg liver ROS,   Endo/Other  negative endocrine ROS  Renal/GU negative Renal ROS  negative genitourinary   Musculoskeletal negative musculoskeletal ROS (+)   Abdominal   Peds negative pediatric ROS (+)  Hematology negative hematology ROS (+)   Anesthesia Other Findings   Reproductive/Obstetrics                             Lab Results  Component Value Date   CREATININE 0.82 01/05/2018   BUN 13 01/05/2018   NA 139 01/05/2018   K 3.9 01/05/2018   CL 106 01/05/2018   CO2 20 (L) 01/05/2018    Lab Results  Component Value Date   WBC 9.0 01/05/2018   HGB 13.1 01/05/2018   HCT 38.2 01/05/2018   MCV 88.4 01/05/2018   PLT 313 01/05/2018    Anesthesia Physical Anesthesia Plan  ASA: III  Anesthesia Plan: General   Post-op Pain Management:    Induction: Intravenous  PONV Risk Score and Plan: 3 and Treatment may vary due to age or medical condition  Airway Management Planned: Oral ETT  Additional Equipment:   Intra-op Plan:   Post-operative Plan: Extubation in OR  Informed Consent: I have reviewed the patients History and Physical, chart, labs and discussed the procedure including the risks, benefits and alternatives for the proposed anesthesia with the patient or authorized representative who has indicated his/her  understanding and acceptance.   Dental advisory given  Plan Discussed with: CRNA  Anesthesia Plan Comments:         Anesthesia Quick Evaluation

## 2018-01-06 NOTE — Anesthesia Procedure Notes (Signed)
Procedure Name: Intubation Date/Time: 01/06/2018 12:38 PM Performed by: Leonor Liv, CRNA Pre-anesthesia Checklist: Patient identified, Emergency Drugs available, Suction available and Patient being monitored Patient Re-evaluated:Patient Re-evaluated prior to induction Oxygen Delivery Method: Circle System Utilized Preoxygenation: Pre-oxygenation with 100% oxygen Induction Type: IV induction Ventilation: Mask ventilation without difficulty Laryngoscope Size: Mac and 3 Grade View: Grade II Tube type: Oral Tube size: 7.0 mm Number of attempts: 1 Airway Equipment and Method: Stylet and Oral airway Placement Confirmation: ETT inserted through vocal cords under direct vision,  positive ETCO2 and breath sounds checked- equal and bilateral Secured at: 21 cm Tube secured with: Tape Dental Injury: Teeth and Oropharynx as per pre-operative assessment

## 2018-01-06 NOTE — Op Note (Addendum)
Laparoscopic Cholecystectomy with IOC Procedure Note  Indications: This patient presents with symptomatic gallbladder disease and will undergo laparoscopic cholecystectomy. The procedure has been discussed with the patient. Operative and non operative treatments have been discussed. Risks of surgery include bleeding, infection,  Common bile duct injury,  Injury to the stomach,liver, colon,small intestine, abdominal wall,  Diaphragm,  Major blood vessels,  And the need for an open procedure.  Other risks include worsening of medical problems, death,  DVT and pulmonary embolism, and cardiovascular events.   Medical options have also been discussed. The patient has been informed of long term expectations of surgery and non surgical options,  The patient agrees to proceed.    Pre-operative Diagnosis: Calculus of gallbladder with acute cholecystitis, without mention of obstruction  Post-operative Diagnosis: Same  Surgeon: Clovis Pu Calle Schader   Assistants: Simaan PA  Anesthesia: General endotracheal anesthesia and Local anesthesia 0.25.% bupivacaine, with epinephrine  ASA Class: 2  Procedure Details  The patient was seen again in the Holding Room. The risks, benefits, complications, treatment options, and expected outcomes were discussed with the patient. The possibilities of reaction to medication, pulmonary aspiration, perforation of viscus, bleeding, recurrent infection, finding a normal gallbladder, the need for additional procedures, failure to diagnose a condition, the possible need to convert to an open procedure, and creating a complication requiring transfusion or operation were discussed with the patient. The patient and/or family concurred with the proposed plan, giving informed consent. The site of surgery properly noted/marked. The patient was taken to Operating Room, identified as Michelle Hanna and the procedure verified as Laparoscopic Cholecystectomy with Intraoperative Cholangiograms. A  Time Out was held and the above information confirmed.  Prior to the induction of general anesthesia, antibiotic prophylaxis was administered. General endotracheal anesthesia was then administered and tolerated well. After the induction, the abdomen was prepped in the usual sterile fashion. The patient was positioned in the supine position with the left arm comfortably tucked, along with some reverse Trendelenburg.  Local anesthetic agent was injected into the skin near the umbilicus and an incision made. The midline fascia was incised and the Hasson technique was used to introduce a 12 mm port under direct vision. It was secured with a figure of eight Vicryl suture placed in the usual fashion. Pneumoperitoneum was then created with CO2 and tolerated well without any adverse changes in the patient's vital signs. Additional trocars were introduced under direct vision with an 11 mm trocar in the epigastrium and TWO  5 mm trocars in the right upper quadrant. All skin incisions were infiltrated with a local anesthetic agent before making the incision and placing the trocars.   The gallbladder was identified, the fundus grasped and retracted cephalad. Adhesions were lysed bluntly and with the electrocautery where indicated, taking care not to injure any adjacent organs or viscus. The infundibulum was grasped and retracted laterally, exposing the peritoneum overlying the triangle of Calot. This was then divided and exposed in a blunt fashion. The cystic duct was clearly identified and bluntly dissected circumferentially. The junctions of the gallbladder, cystic duct and common bile duct were clearly identified prior to the division of any linear structure.   An incision was made in the cystic duct and the cholangiogram catheter introduced. The catheter was secured using an endoclip. The study showed no stones and good visualization of the distal and proximal biliary tree. The catheter was then removed.   The  cystic duct was then  ligated with surgical clips  on  the patient side and  clipped on the gallbladder side and divided. The cystic artery was identified, dissected free, ligated with clips and divided as well. Posterior cystic artery clipped and divided.  The gallbladder was dissected from the liver bed in retrograde fashion with the electrocautery. The gallbladder was removed. The liver bed was irrigated and inspected. Hemostasis was achieved with the electrocautery. Copious irrigation was utilized and was repeatedly aspirated until clear all particulate matter. Hemostasis was achieved with no signs  Of bleeding or bile leakage. 4 quadrant laparoscopy otherwise normal.   Pneumoperitoneum was completely reduced after viewing removal of the trocars under direct vision. The wound was thoroughly irrigated and the fascia was then closed with a figure of eight suture; the skin was then closed with 4 o monocryl  and a sterile dressing of Dermabond  was applied.  Instrument, sponge, and needle counts were correct at closure and at the conclusion of the case.   Findings: Cholecystitis with Cholelithiasis  Estimated Blood Loss: less than 50 mL         Drains: none         Total IV Fluids: per OR RECORD          Specimens: Gallbladder           Complications: None; patient tolerated the procedure well.         Disposition: PACU - hemodynamically stable.         Condition: stable

## 2018-01-06 NOTE — Plan of Care (Signed)
  Problem: Education: Goal: Knowledge of General Education information will improve Outcome: Progressing   Problem: Clinical Measurements: Goal: Ability to maintain clinical measurements within normal limits will improve Outcome: Progressing   Problem: Clinical Measurements: Goal: Will remain free from infection Outcome: Progressing   Problem: Clinical Measurements: Goal: Respiratory complications will improve Outcome: Progressing   Problem: Activity: Goal: Risk for activity intolerance will decrease Outcome: Progressing

## 2018-01-06 NOTE — H&P (View-Only) (Signed)
   Subjective/Chief Complaint: Pain better    Objective: Vital signs in last 24 hours: Temp:  [98.2 F (36.8 C)-98.7 F (37.1 C)] 98.6 F (37 C) (04/15 0512) Pulse Rate:  [66-80] 70 (04/15 0512) Resp:  [16-24] 17 (04/15 0512) BP: (108-148)/(61-78) 109/71 (04/15 0512) SpO2:  [96 %-100 %] 98 % (04/15 0512)    Intake/Output from previous day: 04/14 0701 - 04/15 0700 In: 60 [P.O.:60] Out: 1 [Urine:1] Intake/Output this shift: No intake/output data recorded.  GI: tender RUQ abdomen   Lab Results:  Recent Labs    01/05/18 1206  WBC 9.0  HGB 13.1  HCT 38.2  PLT 313   BMET Recent Labs    01/05/18 1206  NA 139  K 3.9  CL 106  CO2 20*  GLUCOSE 114*  BUN 13  CREATININE 0.82  CALCIUM 9.8   PT/INR No results for input(s): LABPROT, INR in the last 72 hours. ABG No results for input(s): PHART, HCO3 in the last 72 hours.  Invalid input(s): PCO2, PO2  Studies/Results: Us Abdomen Limited Ruq  Result Date: 01/05/2018 CLINICAL DATA:  Right upper quadrant pain. EXAM: ULTRASOUND ABDOMEN LIMITED RIGHT UPPER QUADRANT COMPARISON:  Abdominal ultrasound dated November 06, 2006. FINDINGS: Gallbladder: Cholelithiasis and sludge with borderline wall thickening and small amount of pericholecystic fluid. No sonographic Murphy sign noted by sonographer. Common bile duct: Diameter: 5 mm, normal. Liver: There is a small 1.0 cm simple cyst in the liver adjacent to the gallbladder, minimally increased in size when compared to prior study from February 2008. Within normal limits in parenchymal echogenicity. Portal vein is patent on color Doppler imaging with normal direction of blood flow towards the liver. IMPRESSION: 1. Cholelithiasis and sludge with borderline wall thickening and small amount of pericholecystic fluid. Findings are suspicious for acute cholecystitis. Electronically Signed   By: William T Derry M.D.   On: 01/05/2018 15:29    Anti-infectives: Anti-infectives (From  admission, onward)   Start     Dose/Rate Route Frequency Ordered Stop   01/05/18 1800  cefTRIAXone (ROCEPHIN) 2 g in sodium chloride 0.9 % 100 mL IVPB     2 g 200 mL/hr over 30 Minutes Intravenous Every 24 hours 01/05/18 1754     01/05/18 1545  cefTRIAXone (ROCEPHIN) 2 g in sodium chloride 0.9 % 100 mL IVPB     2 g 200 mL/hr over 30 Minutes Intravenous  Once 01/05/18 1544 01/05/18 1645      Assessment/Plan: Acute cholecystitis   Recommend laparoscopic cholecystectomy  and cholangiogram    The procedure has been discussed with the patient. Operative and non operative treatments have been discussed. Risks of surgery include bleeding, infection,  Common bile duct injury,  Injury to the stomach,liver, colon,small intestine, abdominal wall,  Diaphragm,  Major blood vessels,  And the need for an open procedure.  Other risks include worsening of medical problems, death,  DVT and pulmonary embolism, and cardiovascular events.   Medical options have also been discussed. The patient has been informed of long term expectations of surgery and non surgical options,  The patient agrees to proceed.    LOS: 1 day    Iyah Laguna A Kamoni Depree 01/06/2018 

## 2018-01-07 ENCOUNTER — Encounter (HOSPITAL_COMMUNITY): Payer: Self-pay | Admitting: Surgery

## 2018-01-07 MED ORDER — HYDROCODONE-ACETAMINOPHEN 5-325 MG PO TABS: 1.0000 | ORAL_TABLET | Freq: Four times a day (QID) | ORAL | Status: DC | PRN

## 2018-01-07 MED ORDER — TRAMADOL HCL 50 MG PO TABS: 50.0000 mg | ORAL_TABLET | Freq: Four times a day (QID) | ORAL | Status: DC | PRN

## 2018-01-07 MED ORDER — TRAMADOL HCL 50 MG PO TABS: 100.0000 mg | ORAL_TABLET | Freq: Four times a day (QID) | ORAL | Status: DC | PRN

## 2018-01-07 NOTE — Progress Notes (Signed)
Went over [employer] instructions with pt and spouse. Pt understood need to fu with physician at scheduled appt. All questions answered

## 2019-03-09 DIAGNOSIS — E2839 Other primary ovarian failure: Secondary | ICD-10-CM | POA: Diagnosis not present

## 2019-03-09 DIAGNOSIS — Z1239 Encounter for other screening for malignant neoplasm of breast: Secondary | ICD-10-CM | POA: Diagnosis not present

## 2019-03-09 DIAGNOSIS — F419 Anxiety disorder, unspecified: Secondary | ICD-10-CM | POA: Diagnosis not present

## 2019-03-09 DIAGNOSIS — Z1389 Encounter for screening for other disorder: Secondary | ICD-10-CM | POA: Diagnosis not present

## 2019-03-09 DIAGNOSIS — Z23 Encounter for immunization: Secondary | ICD-10-CM | POA: Diagnosis not present

## 2019-03-09 DIAGNOSIS — Z0000 Encounter for general adult medical examination without abnormal findings: Secondary | ICD-10-CM | POA: Diagnosis not present

## 2019-03-09 DIAGNOSIS — E7800 Pure hypercholesterolemia, unspecified: Secondary | ICD-10-CM | POA: Diagnosis not present

## 2019-03-16 ENCOUNTER — Other Ambulatory Visit: Payer: Self-pay | Admitting: Family Medicine

## 2019-03-17 ENCOUNTER — Other Ambulatory Visit: Payer: Self-pay | Admitting: Family Medicine

## 2019-03-17 DIAGNOSIS — E2839 Other primary ovarian failure: Secondary | ICD-10-CM

## 2019-03-17 DIAGNOSIS — Z1231 Encounter for screening mammogram for malignant neoplasm of breast: Secondary | ICD-10-CM

## 2019-06-19 ENCOUNTER — Other Ambulatory Visit: Payer: Self-pay

## 2019-06-19 ENCOUNTER — Ambulatory Visit
Admission: RE | Admit: 2019-06-19 | Discharge: 2019-06-19 | Disposition: A | Discharge status: Home / Self Care | Payer: Medicare Other | Source: Ambulatory Visit | Attending: Family Medicine | Admitting: Family Medicine

## 2019-06-19 DIAGNOSIS — E2839 Other primary ovarian failure: Secondary | ICD-10-CM

## 2019-06-19 DIAGNOSIS — Z780 Asymptomatic menopausal state: Secondary | ICD-10-CM | POA: Diagnosis not present

## 2019-06-19 DIAGNOSIS — Z1231 Encounter for screening mammogram for malignant neoplasm of breast: Secondary | ICD-10-CM

## 2019-06-19 DIAGNOSIS — M85852 Other specified disorders of bone density and structure, left thigh: Secondary | ICD-10-CM | POA: Diagnosis not present

## 2020-08-22 ENCOUNTER — Other Ambulatory Visit: Payer: Self-pay | Admitting: Family Medicine

## 2020-08-22 DIAGNOSIS — Z1231 Encounter for screening mammogram for malignant neoplasm of breast: Secondary | ICD-10-CM

## 2020-10-04 DIAGNOSIS — F419 Anxiety disorder, unspecified: Secondary | ICD-10-CM | POA: Diagnosis not present

## 2020-10-04 DIAGNOSIS — I10 Essential (primary) hypertension: Secondary | ICD-10-CM | POA: Diagnosis not present

## 2020-10-04 DIAGNOSIS — E7800 Pure hypercholesterolemia, unspecified: Secondary | ICD-10-CM | POA: Diagnosis not present

## 2020-10-05 ENCOUNTER — Ambulatory Visit: Payer: Medicare Other

## 2020-11-14 ENCOUNTER — Ambulatory Visit: Payer: Medicare Other

## 2020-12-26 ENCOUNTER — Ambulatory Visit: Payer: Medicare Other

## 2020-12-26 ENCOUNTER — Ambulatory Visit
Admission: RE | Admit: 2020-12-26 | Discharge: 2020-12-26 | Disposition: A | Discharge status: Home / Self Care | Payer: Medicare Other | Source: Ambulatory Visit | Attending: Family Medicine | Admitting: Family Medicine

## 2020-12-26 ENCOUNTER — Other Ambulatory Visit: Payer: Self-pay

## 2020-12-26 DIAGNOSIS — Z1231 Encounter for screening mammogram for malignant neoplasm of breast: Secondary | ICD-10-CM

## 2021-06-08 DIAGNOSIS — I10 Essential (primary) hypertension: Secondary | ICD-10-CM | POA: Diagnosis not present

## 2021-06-08 DIAGNOSIS — F419 Anxiety disorder, unspecified: Secondary | ICD-10-CM | POA: Diagnosis not present

## 2021-06-08 DIAGNOSIS — Z23 Encounter for immunization: Secondary | ICD-10-CM | POA: Diagnosis not present

## 2022-06-14 DIAGNOSIS — E7800 Pure hypercholesterolemia, unspecified: Secondary | ICD-10-CM | POA: Diagnosis not present

## 2022-06-14 DIAGNOSIS — F3341 Major depressive disorder, recurrent, in partial remission: Secondary | ICD-10-CM | POA: Diagnosis not present

## 2022-06-14 DIAGNOSIS — J4520 Mild intermittent asthma, uncomplicated: Secondary | ICD-10-CM | POA: Diagnosis not present

## 2022-06-14 DIAGNOSIS — F419 Anxiety disorder, unspecified: Secondary | ICD-10-CM | POA: Diagnosis not present

## 2022-06-14 DIAGNOSIS — Z0000 Encounter for general adult medical examination without abnormal findings: Secondary | ICD-10-CM | POA: Diagnosis not present

## 2022-06-14 DIAGNOSIS — I10 Essential (primary) hypertension: Secondary | ICD-10-CM | POA: Diagnosis not present

## 2022-07-17 ENCOUNTER — Other Ambulatory Visit: Payer: Self-pay | Admitting: Family Medicine

## 2022-07-17 DIAGNOSIS — Z1231 Encounter for screening mammogram for malignant neoplasm of breast: Secondary | ICD-10-CM

## 2022-09-11 ENCOUNTER — Ambulatory Visit: Payer: BC Managed Care – PPO

## 2022-11-07 ENCOUNTER — Ambulatory Visit: Payer: BC Managed Care – PPO

## 2022-12-08 IMAGING — MG DIGITAL SCREENING BREAST BILAT IMPLANT W/ TOMO W/ CAD
9 of 14 series · 9 of 34 positions shown · non-contrast
Comparison: Previous exams.

CLINICAL DATA: Screening.

EXAM:
DIGITAL SCREENING BILATERAL MAMMOGRAM WITH IMPLANTS, CAD AND
TOMOSYNTHESIS
TECHNIQUE: Bilateral screening digital craniocaudal and mediolateral oblique
mammograms were obtained. Bilateral screening digital breast
tomosynthesis was performed. The images were evaluated with
computer-aided detection. Standard and/or implant displaced views
were performed.

[L CC]
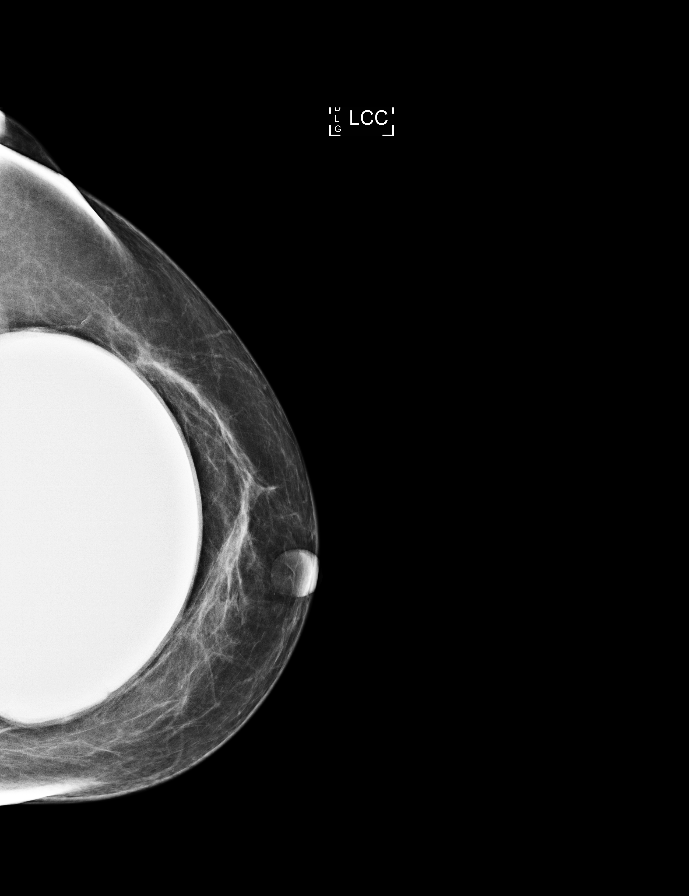

[L MLO]
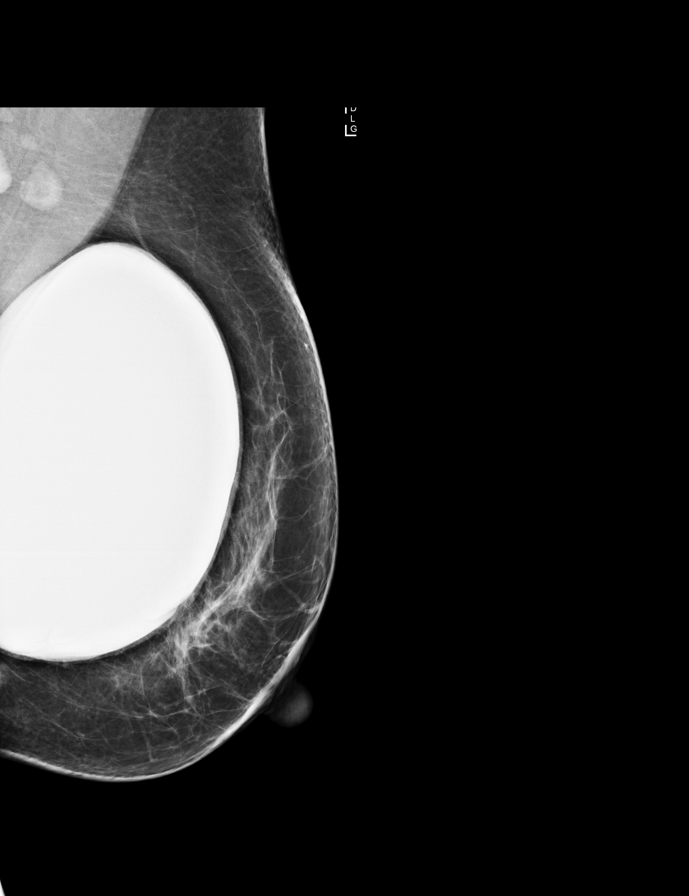

[R MLO]
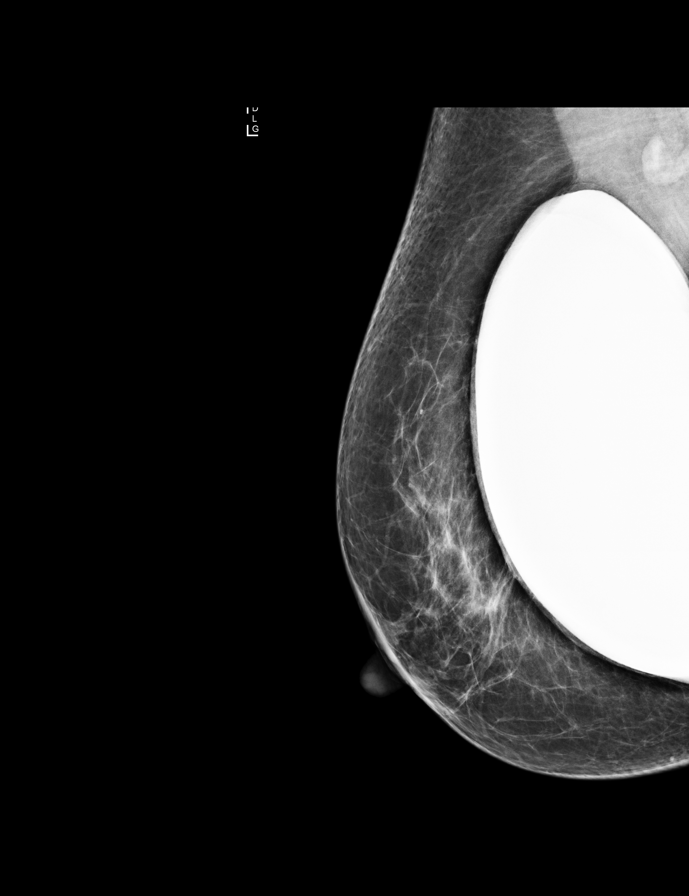

[R CC]
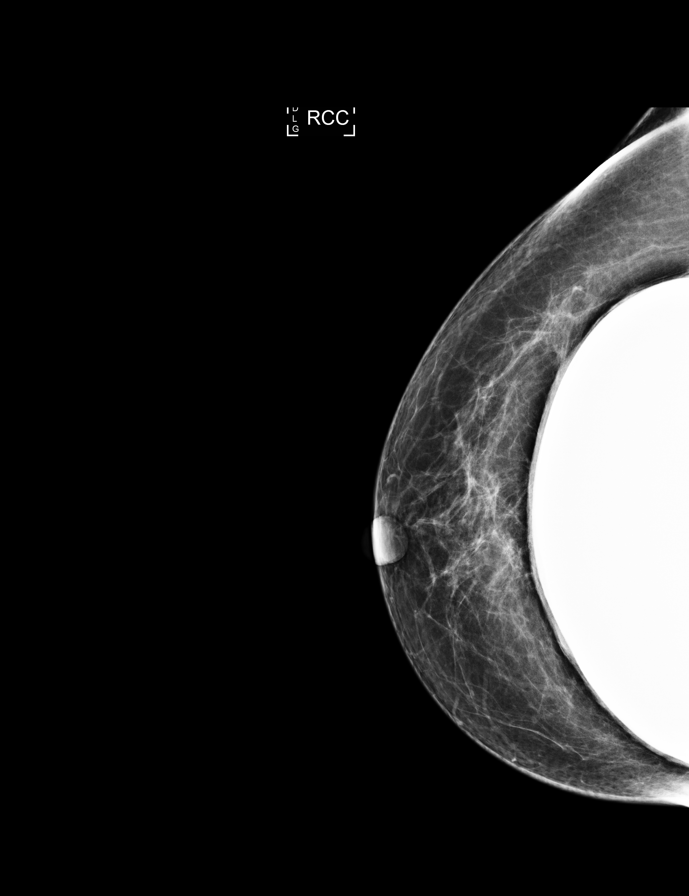

[R CC synth-2D]
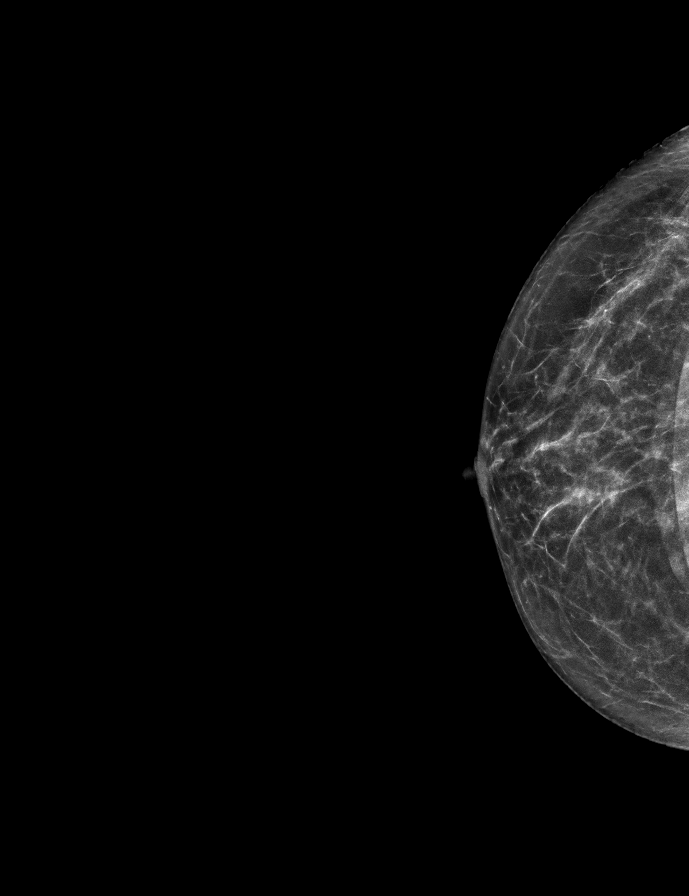

[L MLO synth-2D (1 of 2)]
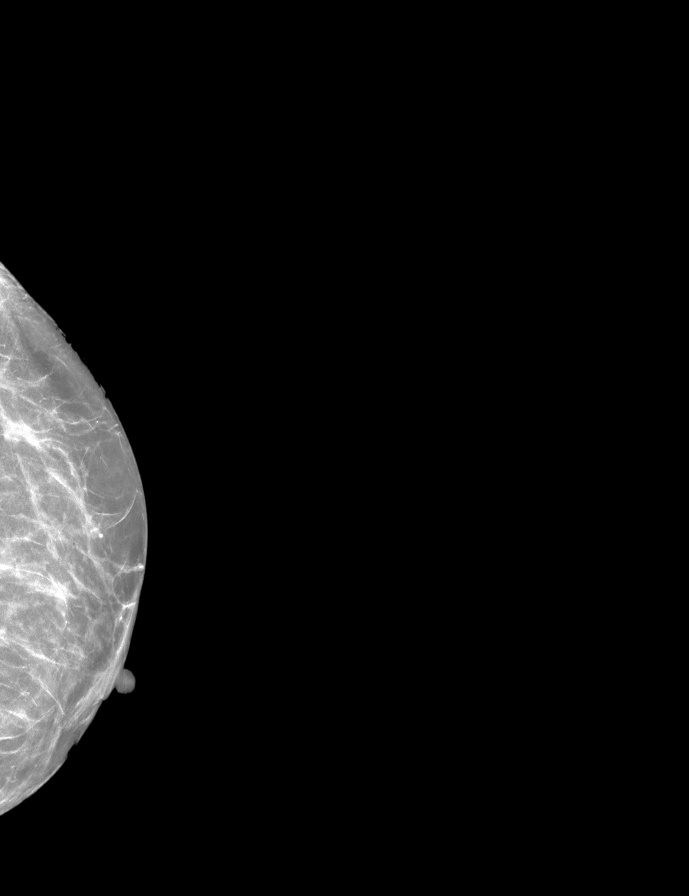

[L MLO synth-2D (2 of 2)]
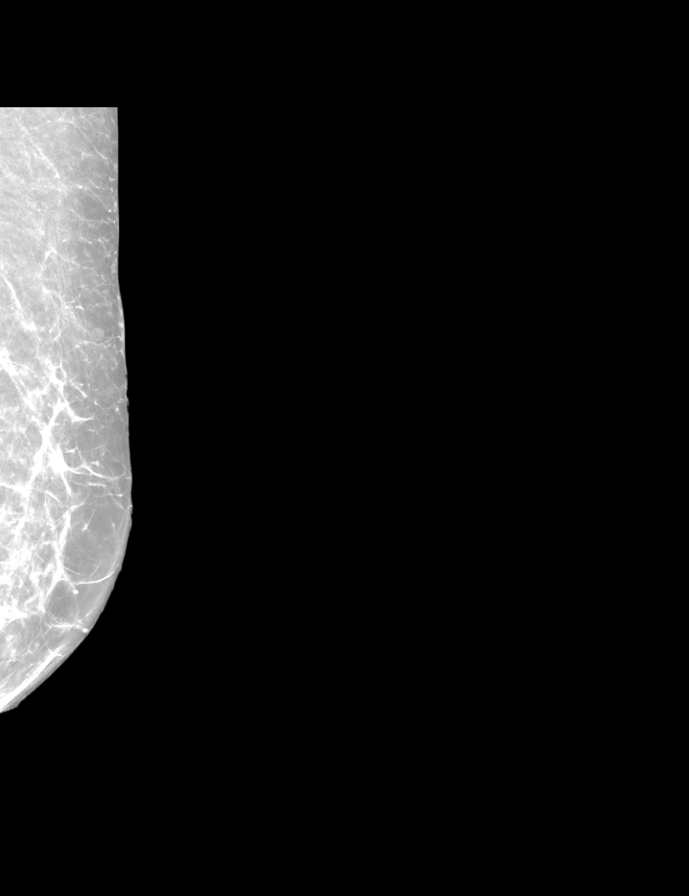

[L CC synth-2D]
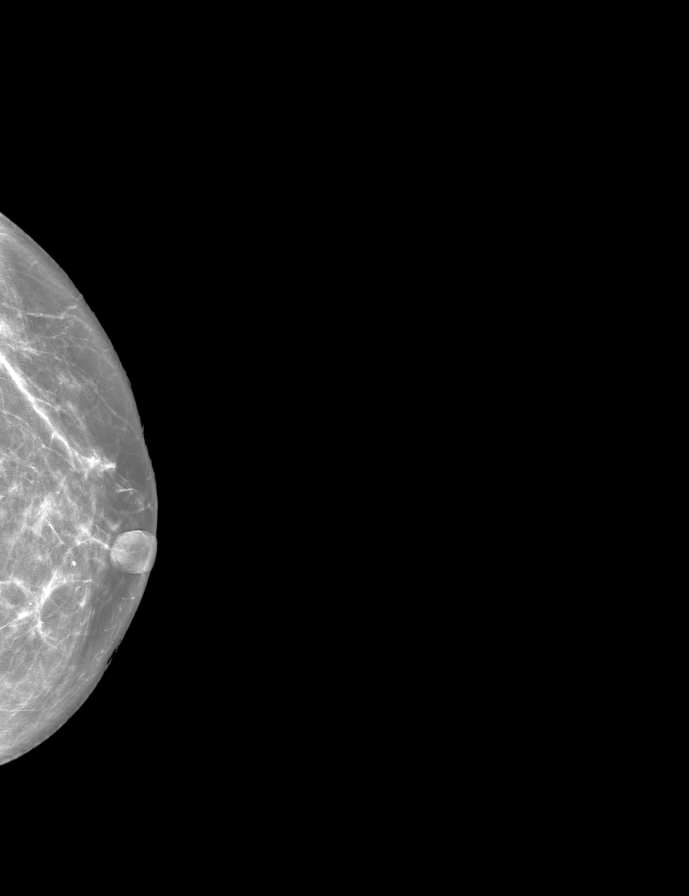

[R MLO synth-2D]
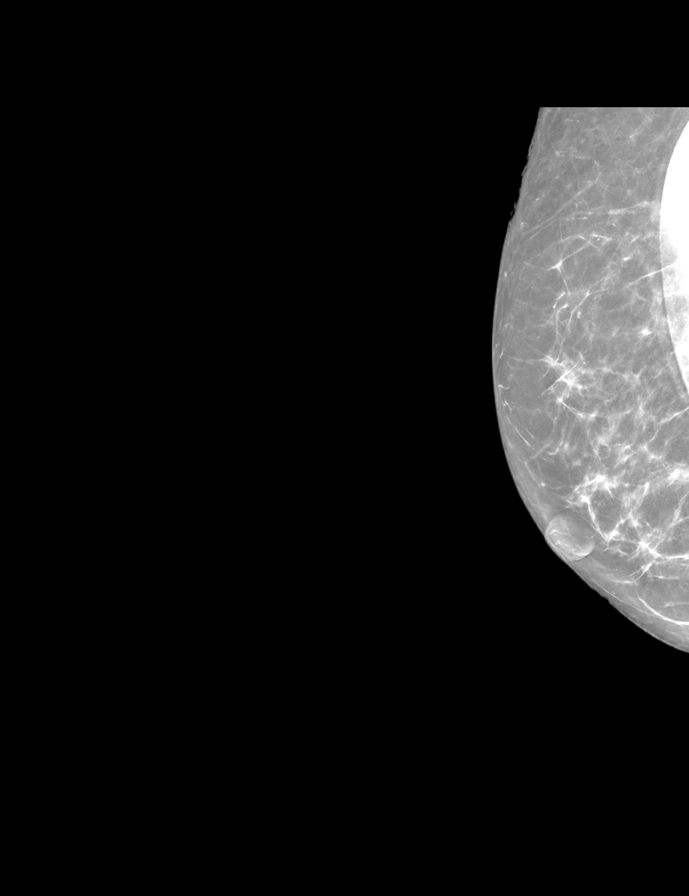

[9 of 34 positions shown; findings below may reference images not displayed]

ACR Breast Density Category b: There are scattered areas of
fibroglandular density.
FINDINGS: The patient has bilateral retroglandular silicone implants. There
are no findings suspicious for malignancy.
IMPRESSION: No mammographic evidence of malignancy. A result letter of this
screening mammogram will be mailed directly to the patient.

RECOMMENDATION:
Screening mammogram in one year. (Code:UX-N-30M)

BI-RADS CATEGORY  1:  Negative.

## 2022-12-21 ENCOUNTER — Ambulatory Visit: Payer: BC Managed Care – PPO

## 2023-02-27 DIAGNOSIS — J4520 Mild intermittent asthma, uncomplicated: Secondary | ICD-10-CM | POA: Diagnosis not present

## 2023-02-27 DIAGNOSIS — F3341 Major depressive disorder, recurrent, in partial remission: Secondary | ICD-10-CM | POA: Diagnosis not present

## 2023-02-27 DIAGNOSIS — I10 Essential (primary) hypertension: Secondary | ICD-10-CM | POA: Diagnosis not present

## 2023-02-27 DIAGNOSIS — F419 Anxiety disorder, unspecified: Secondary | ICD-10-CM | POA: Diagnosis not present

## 2023-02-27 DIAGNOSIS — E7800 Pure hypercholesterolemia, unspecified: Secondary | ICD-10-CM | POA: Diagnosis not present

## 2023-06-25 DIAGNOSIS — B028 Zoster with other complications: Secondary | ICD-10-CM | POA: Diagnosis not present

## 2023-08-01 DIAGNOSIS — E7800 Pure hypercholesterolemia, unspecified: Secondary | ICD-10-CM | POA: Diagnosis not present

## 2023-08-01 DIAGNOSIS — I10 Essential (primary) hypertension: Secondary | ICD-10-CM | POA: Diagnosis not present

## 2023-08-05 DIAGNOSIS — Z1331 Encounter for screening for depression: Secondary | ICD-10-CM | POA: Diagnosis not present

## 2023-08-05 DIAGNOSIS — K219 Gastro-esophageal reflux disease without esophagitis: Secondary | ICD-10-CM | POA: Diagnosis not present

## 2023-08-05 DIAGNOSIS — M129 Arthropathy, unspecified: Secondary | ICD-10-CM | POA: Diagnosis not present

## 2023-08-05 DIAGNOSIS — G43909 Migraine, unspecified, not intractable, without status migrainosus: Secondary | ICD-10-CM | POA: Diagnosis not present

## 2023-08-05 DIAGNOSIS — Z0000 Encounter for general adult medical examination without abnormal findings: Secondary | ICD-10-CM | POA: Diagnosis not present

## 2023-08-05 DIAGNOSIS — J4520 Mild intermittent asthma, uncomplicated: Secondary | ICD-10-CM | POA: Diagnosis not present

## 2023-08-05 DIAGNOSIS — J309 Allergic rhinitis, unspecified: Secondary | ICD-10-CM | POA: Diagnosis not present

## 2023-08-05 DIAGNOSIS — F3342 Major depressive disorder, recurrent, in full remission: Secondary | ICD-10-CM | POA: Diagnosis not present

## 2023-08-05 DIAGNOSIS — F419 Anxiety disorder, unspecified: Secondary | ICD-10-CM | POA: Diagnosis not present

## 2024-03-23 DIAGNOSIS — H25013 Cortical age-related cataract, bilateral: Secondary | ICD-10-CM | POA: Diagnosis not present

## 2024-03-23 DIAGNOSIS — H2511 Age-related nuclear cataract, right eye: Secondary | ICD-10-CM | POA: Diagnosis not present

## 2024-03-23 DIAGNOSIS — H25043 Posterior subcapsular polar age-related cataract, bilateral: Secondary | ICD-10-CM | POA: Diagnosis not present

## 2024-03-23 DIAGNOSIS — H2513 Age-related nuclear cataract, bilateral: Secondary | ICD-10-CM | POA: Diagnosis not present

## 2024-06-18 DIAGNOSIS — H2511 Age-related nuclear cataract, right eye: Secondary | ICD-10-CM | POA: Diagnosis not present

## 2024-06-18 DIAGNOSIS — H25042 Posterior subcapsular polar age-related cataract, left eye: Secondary | ICD-10-CM | POA: Diagnosis not present

## 2024-06-18 DIAGNOSIS — H2512 Age-related nuclear cataract, left eye: Secondary | ICD-10-CM | POA: Diagnosis not present

## 2024-06-18 DIAGNOSIS — H25012 Cortical age-related cataract, left eye: Secondary | ICD-10-CM | POA: Diagnosis not present

## 2024-07-09 DIAGNOSIS — H25042 Posterior subcapsular polar age-related cataract, left eye: Secondary | ICD-10-CM | POA: Diagnosis not present

## 2024-07-09 DIAGNOSIS — H2512 Age-related nuclear cataract, left eye: Secondary | ICD-10-CM | POA: Diagnosis not present

## 2024-07-09 DIAGNOSIS — H25012 Cortical age-related cataract, left eye: Secondary | ICD-10-CM | POA: Diagnosis not present

## 2024-08-07 DIAGNOSIS — I10 Essential (primary) hypertension: Secondary | ICD-10-CM | POA: Diagnosis not present

## 2024-08-07 DIAGNOSIS — E7800 Pure hypercholesterolemia, unspecified: Secondary | ICD-10-CM | POA: Diagnosis not present

## 2024-08-11 DIAGNOSIS — E7800 Pure hypercholesterolemia, unspecified: Secondary | ICD-10-CM | POA: Diagnosis not present

## 2024-08-11 DIAGNOSIS — G43909 Migraine, unspecified, not intractable, without status migrainosus: Secondary | ICD-10-CM | POA: Diagnosis not present

## 2024-08-11 DIAGNOSIS — Z1331 Encounter for screening for depression: Secondary | ICD-10-CM | POA: Diagnosis not present

## 2024-08-11 DIAGNOSIS — Z13820 Encounter for screening for osteoporosis: Secondary | ICD-10-CM | POA: Diagnosis not present

## 2024-08-11 DIAGNOSIS — J4520 Mild intermittent asthma, uncomplicated: Secondary | ICD-10-CM | POA: Diagnosis not present

## 2024-08-11 DIAGNOSIS — Z0000 Encounter for general adult medical examination without abnormal findings: Secondary | ICD-10-CM | POA: Diagnosis not present

## 2024-08-11 DIAGNOSIS — F419 Anxiety disorder, unspecified: Secondary | ICD-10-CM | POA: Diagnosis not present

## 2024-08-11 DIAGNOSIS — I10 Essential (primary) hypertension: Secondary | ICD-10-CM | POA: Diagnosis not present

## 2024-08-11 DIAGNOSIS — F39 Unspecified mood [affective] disorder: Secondary | ICD-10-CM | POA: Diagnosis not present

## 2024-08-14 ENCOUNTER — Other Ambulatory Visit: Payer: Self-pay | Admitting: Family Medicine

## 2024-08-14 DIAGNOSIS — E2839 Other primary ovarian failure: Secondary | ICD-10-CM

## 2024-08-14 DIAGNOSIS — Z13820 Encounter for screening for osteoporosis: Secondary | ICD-10-CM
